# Patient Record
Sex: Female | Born: 1985 | Race: Asian | Hispanic: No | Marital: Single | Smoking: Never smoker
Health system: Southern US, Community
[De-identification: ages and names within clinical notes are randomized; demographics above are authoritative.]

## PROBLEM LIST (undated history)

## (undated) DIAGNOSIS — I10 Essential (primary) hypertension: Secondary | ICD-10-CM

## (undated) HISTORY — PX: APPENDECTOMY: SHX54

---

## 2015-06-20 ENCOUNTER — Ambulatory Visit (INDEPENDENT_AMBULATORY_CARE_PROVIDER_SITE_OTHER): Payer: Managed Care, Other (non HMO) | Admitting: Family Medicine

## 2015-06-20 ENCOUNTER — Ambulatory Visit (INDEPENDENT_AMBULATORY_CARE_PROVIDER_SITE_OTHER): Payer: Managed Care, Other (non HMO)

## 2015-06-20 ENCOUNTER — Encounter (HOSPITAL_COMMUNITY): Payer: Self-pay | Admitting: Emergency Medicine

## 2015-06-20 VITALS — BP 182/116 | HR 70 | Temp 98.2°F | Resp 16 | Ht 66.0 in | Wt 259.0 lb

## 2015-06-20 DIAGNOSIS — Z3202 Encounter for pregnancy test, result negative: Secondary | ICD-10-CM | POA: Insufficient documentation

## 2015-06-20 DIAGNOSIS — I160 Hypertensive urgency: Secondary | ICD-10-CM

## 2015-06-20 DIAGNOSIS — I517 Cardiomegaly: Secondary | ICD-10-CM

## 2015-06-20 DIAGNOSIS — I10 Essential (primary) hypertension: Secondary | ICD-10-CM | POA: Insufficient documentation

## 2015-06-20 DIAGNOSIS — R9431 Abnormal electrocardiogram [ECG] [EKG]: Secondary | ICD-10-CM

## 2015-06-20 NOTE — ED Notes (Signed)
Pt from [facility] for eval of HTN, states hx of same but has not been on meds "in a long time." pt reports starting new job that she has to travel for so has not been working out, eating as well as she normally does, or sleeping as much. Pt with no other complaints, no neuro deficits noted at this time.

## 2015-06-20 NOTE — Progress Notes (Signed)
   06/20/2015 6:37 PM   DOB: 1986-04-17 / MRN: [id number]  SUBJECTIVE:  Taylor Gill is a 30 y.o. female presenting for a BP check.  She is just coming from a physical for work and was advised that her BP was high and told she needed to be seen by a provider for this problem.  She denies HA, chest pain, numbness vision changes, paresthesia, vision changes.  She has had this problem before and had been taking HCTZ 12.5 mg.   She has No Known Allergies.   She  has no past medical history on file.    She  reports that she has never smoked. She does not have any smokeless tobacco history on file. She  has no sexual activity history on file. The patient  has past surgical history that includes Appendectomy.  Her family history is not on file.  Review of Systems  Constitutional: Negative for fever and chills.  Respiratory: Negative for cough and shortness of breath.   Cardiovascular: Negative for chest pain.  Gastrointestinal: Negative for nausea.  Skin: Negative for rash.  Neurological: Negative for dizziness and headaches.    Problem list and medications reviewed and updated by myself where necessary, and exist elsewhere in the encounter.   OBJECTIVE:  BP 182/116 mmHg  Pulse 70  Temp(Src) 98.2 F (36.8 C)  Resp 16  Ht  (1.676 m)  Wt 259 lb (117.482 kg)  BMI 41.82 kg/m2  SpO2 98%  LMP 05/29/2015  Physical Exam  Constitutional: She is oriented to person, place, and time. She appears well-nourished. No distress.  Eyes: EOM are normal. Pupils are equal, round, and reactive to light.  Cardiovascular: Normal rate, regular rhythm, normal heart sounds and intact distal pulses.  Exam reveals no gallop and no friction rub.   No murmur heard. Pulmonary/Chest: Effort normal and breath sounds normal.  Abdominal: She exhibits no distension.  Neurological: She is alert and oriented to person, place, and time. No cranial nerve deficit. Gait normal.  Skin: Skin is dry. She is not  diaphoretic.  Psychiatric: She has a normal mood and affect.  Vitals reviewed.    No results found for this or any previous visit (from the past 72 hour(s)).  No results found.  ASSESSMENT AND PLAN  Taylor Gill was seen today for hypertension.  Diagnoses and all orders for this visit:  Hypertensive urgency: Patient with 225/115 by my exam.  She has mild cardiomegaly on rads and some non specific EKG changes. Case discussed with Dr. Alwyn Ren and he is in agreement. Will send to the ED for further evaluation and treatment.  She may benefit from Cardiologist eval.  She has refused an ambulance tonight.  I advised she call a cab or have a friend drive her.  -     EKG 12-Lead -     POCT CBC -     Creatinine with Est GFR -     DG Chest 2 View; Future -     POCT urine pregnancy  Cardiomegaly: Patient refused ambulance.    Nonspecific abnormal electrocardiogram (ECG) (EKG): Patient refused ambulance.     The patient was advised to call or return to clinic if she does not see an improvement in symptoms or to seek the care of the closest emergency department if she worsens with the above plan.   Deliah Boston, MHS, PA-C [facility] 06/20/2015 6:37 PM

## 2015-06-20 NOTE — Patient Instructions (Addendum)
Because you received an x-ray today, you will receive an invoice from [facility]. Please contact [facility] at [phone] with questions or concerns regarding your invoice. Our billing staff will not be able to assist you with those questions.  [address]  [place], [address]  [phone]

## 2015-06-20 NOTE — Progress Notes (Signed)
Patient seen per the request of Taylor Boston PA-C. She had a job exam today and had a very high blood pressure. She was sent here to have it controlled so she could be approved for her job. The PA was concerned because he got a very high reading of 225/115.   I spoke with the patient. She has not had any chest pain or palpitations or excessive shortness of breath. She has had hypertension in the past, had been on hydrochlorothiazide, stopped taking it a year ago. She checks her blood pressure occasionally and it is up and down. She has never had it is extremely high. No headaches or dizziness.  Chest x-ray does appear to have some cardiomegaly though it may be a little magnified because of her habitus. She has a probably normal EKG with some mild ST elevation in V2 and V3 which is upsloping.  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

## 2015-06-21 ENCOUNTER — Emergency Department (HOSPITAL_COMMUNITY)
Admission: EM | Admit: 2015-06-21 | Discharge: 2015-06-21 | Disposition: A | Discharge status: Home / Self Care | Payer: Managed Care, Other (non HMO) | Attending: Emergency Medicine | Admitting: Emergency Medicine

## 2015-06-21 DIAGNOSIS — I10 Essential (primary) hypertension: Secondary | ICD-10-CM

## 2015-06-21 HISTORY — DX: Essential (primary) hypertension: I10

## 2015-06-21 LAB — URINALYSIS, ROUTINE W REFLEX MICROSCOPIC
Bilirubin Urine: NEGATIVE
GLUCOSE, UA: NEGATIVE mg/dL
HGB URINE DIPSTICK: NEGATIVE
KETONES UR: NEGATIVE mg/dL
LEUKOCYTES UA: NEGATIVE
Nitrite: NEGATIVE
PH: 5.5 (ref 5.0–8.0)
PROTEIN: NEGATIVE mg/dL
Specific Gravity, Urine: 1.026 (ref 1.005–1.030)

## 2015-06-21 LAB — I-STAT CHEM 8, ED
BUN: 12 mg/dL (ref 6–20)
CHLORIDE: 101 mmol/L (ref 101–111)
Calcium, Ion: 1.02 mmol/L — ABNORMAL LOW (ref 1.12–1.23)
Creatinine, Ser: 0.7 mg/dL (ref 0.44–1.00)
Glucose, Bld: 98 mg/dL (ref 65–99)
HEMATOCRIT: 43 % (ref 36.0–46.0)
Hemoglobin: 14.6 g/dL (ref 12.0–15.0)
Potassium: 3.6 mmol/L (ref 3.5–5.1)
SODIUM: 139 mmol/L (ref 135–145)
TCO2: 26 mmol/L (ref 0–100)

## 2015-06-21 LAB — I-STAT BETA HCG BLOOD, ED (MC, WL, AP ONLY): I-stat hCG, quantitative: <5 m[IU]/mL (ref <5)

## 2015-06-21 MED ORDER — HYDROCHLOROTHIAZIDE 12.5 MG PO TABS
12.5000 mg | ORAL_TABLET | Freq: Every day | ORAL | Status: AC
Start: 2015-06-21 — End: ?

## 2015-06-21 NOTE — ED Notes (Signed)
Called for room by two separate staff members - no answer.

## 2015-06-21 NOTE — ED Provider Notes (Signed)
By signing my name below, I, Arlan Organ, attest that this documentation has been prepared under the direction and in the presence of Kristen N Ward, DO.  Electronically Signed: Arlan Organ, ED Scribe. 06/21/2015. 3:40 AM.   TIME SEEN: 3:35 AM   CHIEF COMPLAINT:  Chief Complaint  Patient presents with  . Hypertension     HPI:  HPI Comments: Taylor Gill from [facility] is a 30 y.o. female with a PMHx of HTN who presents to the Emergency Department here for hypertension this evening. Pt recently started a new job and will be traveling often. In the process of her job transition, pt has not been able to work out and hasn't been eating as well. No recent blurred vision, chest pain, shortness of breath, tingling or numbness, focal weakness, headache. Pt was prescribed HCTZ 12.5 mg some time ago but has not been complaint with this medication as her PCP is in a different state. Pt was evaluated at [facility] earlier for same. At that time, EKG and CXR performed without any abnormal findings. However, pt was advised to come to the Emergency Department for further evaluation.  PCP: No primary care provider on file.    ROS: See HPI Constitutional: no fever  Eyes: no drainage  ENT: no runny nose   Cardiovascular:  no chest pain  Resp: no SOB  GI: no vomiting GU: no dysuria Integumentary: no rash  Allergy: no hives  Musculoskeletal: no leg swelling  Neurological: no slurred speech ROS otherwise negative  PAST MEDICAL HISTORY/PAST SURGICAL HISTORY:  Past Medical History  Diagnosis Date  . Hypertension     MEDICATIONS:  Prior to Admission medications   Not on File    ALLERGIES:  No Known Allergies  SOCIAL HISTORY:  Social History  Substance Use Topics  . Smoking status: Never Smoker   . Smokeless tobacco: Not on file  . Alcohol Use: Not on file    FAMILY HISTORY: No family history on file.  EXAM: BP 186/94 mmHg  Pulse 64  Resp 19  Ht  (1.676 m)  Wt 259 lb  (117.482 kg)  BMI 41.82 kg/m2  SpO2 96%  LMP 05/29/2015 CONSTITUTIONAL: Alert and oriented and responds appropriately to questions. Well-appearing; well-nourished HEAD: Normocephalic EYES: Conjunctivae clear, PERRL ENT: normal nose; no rhinorrhea; moist mucous membranes; pharynx without lesions noted NECK: Supple, no meningismus, no LAD  CARD: RRR; S1 and S2 appreciated; no murmurs, no clicks, no rubs, no gallops RESP: Normal chest excursion without splinting or tachypnea; breath sounds clear and equal bilaterally; no wheezes, no rhonchi, no rales, no hypoxia or respiratory distress, speaking full sentences ABD/GI: Normal bowel sounds; non-distended; soft, non-tender, no rebound, no guarding, no peritoneal signs BACK:  The back appears normal and is non-tender to palpation, there is no CVA tenderness EXT: Normal ROM in all joints; non-tender to palpation; no edema; normal capillary refill; no cyanosis, no calf tenderness or swelling    SKIN: Normal color for age and race; warm NEURO: Moves all extremities equally, sensation to light touch intact diffusely, cranial nerves II through XII intact PSYCH: The patient's mood and manner are appropriate. Grooming and personal hygiene are appropriate.  MEDICAL DECISION MAKING: Patient here with asymptomatic hypertension. Patient was previously on hydrochlorothiazide 12.5 mg daily. She agrees to restart this medication. Her blood pressure is 183/92. She has absolutely no symptoms. Labs show no sign of end organ damage. Urine shows no blood or protein. We'll give her primary care physician follow-up in [place]  since she will be here during the week. I feel she is safe to go back to work. Discussed return precautions. Have advised that she check her blood pressure daily and keep a log of this that she can presented to her primary care physician for follow-up.  She verbalizes understanding and is comfortable with this plan.    EKG  Interpretation  Date/Time:  Tuesday June 21 2015 03:29:03 EST Ventricular Rate:  73 PR Interval:  157 QRS Duration: 99 QT Interval:  417 QTC Calculation: 459 R Axis:   -6 Text Interpretation:  Sinus rhythm Low voltage, precordial leads Anteroseptal infarct, old Minimal ST depression, lateral leads Baseline wander in lead(s) I II aVR aVL aVF V1 V2 V3 V4 V5 V6 No old tracing to compare Confirmed by WARD,  DO, KRISTEN ([id number]) on 06/21/2015 3:54:20 AM         I personally performed the services described in this documentation, which was scribed in my presence. The recorded information has been reviewed and is accurate.   Layla Maw Ward, DO 06/21/15 0500

## 2015-06-21 NOTE — Discharge Instructions (Signed)
I recommend that you keep a long of your blood pressure every day when you're feeling well so that she may present this to your primary care physician. We have given you a list of primary care providers in this area. There are also [facility], [facility], and [facility] offices in multiple different areas throughout [place] that are accepting new patients.   DASH Eating Plan DASH stands for "Dietary Approaches to Stop Hypertension." The DASH eating plan is a healthy eating plan that has been shown to reduce high blood pressure (hypertension). Additional health benefits may include reducing the risk of type 2 diabetes mellitus, heart disease, and stroke. The DASH eating plan may also help with weight loss. WHAT DO I NEED TO KNOW ABOUT THE DASH EATING PLAN? For the DASH eating plan, you will follow these general guidelines:  Choose foods with a percent daily value for sodium of less than 5% (as listed on the food label).  Use salt-free seasonings or herbs instead of table salt or sea salt.  Check with your health care provider or pharmacist before using salt substitutes.  Eat lower-sodium products, often labeled as "lower sodium" or "no salt added."  Eat fresh foods.  Eat more vegetables, fruits, and low-fat dairy products.  Choose whole grains. Look for the word "whole" as the first word in the ingredient list.  Choose fish and skinless chicken or [place] more often than red meat. Limit fish, poultry, and meat to 6 oz (170 g) each day.  Limit sweets, desserts, sugars, and sugary drinks.  Choose heart-healthy fats.  Limit cheese to 1 oz (28 g) per day.  Eat more home-cooked food and less restaurant, buffet, and fast food.  Limit fried foods.  Cook foods using methods other than frying.  Limit canned vegetables. If you do use them, rinse them well to decrease the sodium.  When eating at a restaurant, ask that your food be prepared with less salt, or no salt if possible. WHAT FOODS CAN  I EAT? Seek help from a dietitian for individual calorie needs. Grains Whole grain or whole wheat bread. Brown rice. Whole grain or whole wheat pasta. Quinoa, bulgur, and whole grain cereals. Low-sodium cereals. Corn or whole wheat flour tortillas. Whole grain cornbread. Whole grain crackers. Low-sodium crackers. Vegetables Fresh or frozen vegetables (raw, steamed, roasted, or grilled). Low-sodium or reduced-sodium tomato and vegetable juices. Low-sodium or reduced-sodium tomato sauce and paste. Low-sodium or reduced-sodium canned vegetables.  Fruits All fresh, canned (in natural juice), or frozen fruits. Meat and Other Protein Products Ground beef (85% or leaner), grass-fed beef, or beef trimmed of fat. Skinless chicken or [place]. Ground chicken or [place]. Pork trimmed of fat. All fish and seafood. Eggs. Dried beans, peas, or lentils. Unsalted nuts and seeds. Unsalted canned beans. Dairy Low-fat dairy products, such as skim or 1% milk, 2% or reduced-fat cheeses, low-fat ricotta or cottage cheese, or plain low-fat yogurt. Low-sodium or reduced-sodium cheeses. Fats and Oils Tub margarines without trans fats. Light or reduced-fat mayonnaise and salad dressings (reduced sodium). Avocado. Safflower, olive, or canola oils. Natural peanut or almond butter. Other Unsalted popcorn and pretzels. The items listed above may not be a complete list of recommended foods or beverages. Contact your dietitian for more options. WHAT FOODS ARE NOT RECOMMENDED? Grains White bread. White pasta. White rice. Refined cornbread. Bagels and croissants. Crackers that contain trans fat. Vegetables Creamed or fried vegetables. Vegetables in a cheese sauce. Regular canned vegetables. Regular canned tomato sauce and paste. Regular tomato and  vegetable juices. Fruits Dried fruits. Canned fruit in light or heavy syrup. Fruit juice. Meat and Other Protein Products Fatty cuts of meat. Ribs, chicken wings, bacon, sausage,  bologna, salami, chitterlings, fatback, hot dogs, bratwurst, and packaged luncheon meats. Salted nuts and seeds. Canned beans with salt. Dairy Whole or 2% milk, cream, half-and-half, and cream cheese. Whole-fat or sweetened yogurt. Full-fat cheeses or blue cheese. Nondairy creamers and whipped toppings. Processed cheese, cheese spreads, or cheese curds. Condiments Onion and garlic salt, seasoned salt, table salt, and sea salt. Canned and packaged gravies. Worcestershire sauce. Tartar sauce. Barbecue sauce. Teriyaki sauce. Soy sauce, including reduced sodium. Steak sauce. Fish sauce. Oyster sauce. Cocktail sauce. Horseradish. Ketchup and mustard. Meat flavorings and tenderizers. Bouillon cubes. Hot sauce. Tabasco sauce. Marinades. Taco seasonings. Relishes. Fats and Oils Butter, stick margarine, lard, shortening, ghee, and bacon fat. Coconut, palm kernel, or palm oils. Regular salad dressings. Other Pickles and olives. Salted popcorn and pretzels. The items listed above may not be a complete list of foods and beverages to avoid. Contact your dietitian for more information. WHERE CAN I FIND MORE INFORMATION? National Heart, Lung, and Blood Institute: [employer]Prom[employer]it   This information is not intended to replace advice given to you by your health care provider. Make sure you discuss any questions you have with your health care provider.   Document Released: 04/05/2011 Document Revised: 05/07/2014 Document Reviewed: 02/18/2013 Elsevier Interactive Patient Education 2016 [employer].  Hypertension Hypertension, commonly called high blood pressure, is when the force of blood pumping through your arteries is too strong. Your arteries are the blood vessels that carry blood from your heart throughout your body. A blood pressure reading consists of a higher number over a lower number, such as 110/72. The higher number (systolic) is the pressure inside your arteries when  your heart pumps. The lower number (diastolic) is the pressure inside your arteries when your heart relaxes. Ideally you want your blood pressure below 120/80. Hypertension forces your heart to work harder to pump blood. Your arteries may become narrow or stiff. Having untreated or uncontrolled hypertension can cause heart attack, stroke, kidney disease, and other problems. RISK FACTORS Some risk factors for high blood pressure are controllable. Others are not.  Risk factors you cannot control include:   Race. You may be at higher risk if you are African American.  Age. Risk increases with age.  Gender. Men are at higher risk than women before age 32 years. After age 54, women are at higher risk than men. Risk factors you can control include:  Not getting enough exercise or physical activity.  Being overweight.  Getting too much fat, sugar, calories, or salt in your diet.  Drinking too much alcohol. SIGNS AND SYMPTOMS Hypertension does not usually cause signs or symptoms. Extremely high blood pressure (hypertensive crisis) may cause headache, anxiety, shortness of breath, and nosebleed. DIAGNOSIS To check if you have hypertension, your health care provider will measure your blood pressure while you are seated, with your arm held at the level of your heart. It should be measured at least twice using the same arm. Certain conditions can cause a difference in blood pressure between your right and left arms. A blood pressure reading that is higher than normal on one occasion does not mean that you need treatment. If it is not clear whether you have high blood pressure, you may be asked to return on a different day to have your blood pressure checked again. Or, you may be asked  to monitor your blood pressure at home for 1 or more weeks. TREATMENT Treating high blood pressure includes making lifestyle changes and possibly taking medicine. Living a healthy lifestyle can help lower high blood  pressure. You may need to change some of your habits. Lifestyle changes may include:  Following the DASH diet. This diet is high in fruits, vegetables, and whole grains. It is low in salt, red meat, and added sugars.  Keep your sodium intake below 2,300 mg per day.  Getting at least 30-45 minutes of aerobic exercise at least 4 times per week.  Losing weight if necessary.  Not smoking.  Limiting alcoholic beverages.  Learning ways to reduce stress. Your health care provider may prescribe medicine if lifestyle changes are not enough to get your blood pressure under control, and if one of the following is true:  You are 44-77 years of age and your systolic blood pressure is above 140.  You are 12 years of age or older, and your systolic blood pressure is above 150.  Your diastolic blood pressure is above 90.  You have diabetes, and your systolic blood pressure is over 140 or your diastolic blood pressure is over 90.  You have kidney disease and your blood pressure is above 140/90.  You have heart disease and your blood pressure is above 140/90. Your personal target blood pressure may vary depending on your medical conditions, your age, and other factors. HOME CARE INSTRUCTIONS  Have your blood pressure rechecked as directed by your health care provider.   Take medicines only as directed by your health care provider. Follow the directions carefully. Blood pressure medicines must be taken as prescribed. The medicine does not work as well when you skip doses. Skipping doses also puts you at risk for problems.  Do not smoke.   Monitor your blood pressure at home as directed by your health care provider. SEEK MEDICAL CARE IF:   You think you are having a reaction to medicines taken.  You have recurrent headaches or feel dizzy.  You have swelling in your ankles.  You have trouble with your vision. SEEK IMMEDIATE MEDICAL CARE IF:  You develop a severe headache or  confusion.  You have unusual weakness, numbness, or feel faint.  You have severe chest or abdominal pain.  You vomit repeatedly.  You have trouble breathing. MAKE SURE YOU:   Understand these instructions.  Will watch your condition.  Will get help right away if you are not doing well or get worse.   This information is not intended to replace advice given to you by your health care provider. Make sure you discuss any questions you have with your health care provider.   Document Released: 04/16/2005 Document Revised: 08/31/2014 Document Reviewed: 02/06/2013 Elsevier Interactive Patient Education 2016 [employer].  How to Take Your Blood Pressure HOW DO I GET A BLOOD PRESSURE MACHINE?  You can buy an electronic home blood pressure machine at your local pharmacy. Insurance will sometimes cover the cost if you have a prescription.  Ask your doctor what type of machine is best for you. There are different machines for your arm and your wrist.  If you decide to buy a machine to check your blood pressure on your arm, first check the size of your arm so you can buy the right size cuff. To check the size of your arm:   Use a measuring tape that shows both inches and centimeters.   Wrap the measuring tape around the  upper-middle part of your arm. You may need someone to help you measure.   Write down your arm measurement in both inches and centimeters.   To measure your blood pressure correctly, it is important to have the right size cuff.   If your arm is up to 13 inches (up to 34 centimeters), get an adult cuff size.  If your arm is 13 to 17 inches (35 to 44 centimeters), get a large adult cuff size.    If your arm is 17 to 20 inches (45 to 52 centimeters), get an adult thigh cuff.  WHAT DO THE NUMBERS MEAN?   There are two numbers that make up your blood pressure. For example: 120/80.  The first number (120 in our example) is called the "systolic pressure." It is a  measure of the pressure in your blood vessels when your heart is pumping blood.  The second number (80 in our example) is called the "diastolic pressure." It is a measure of the pressure in your blood vessels when your heart is resting between beats.  Your doctor will tell you what your blood pressure should be. WHAT SHOULD I DO BEFORE I CHECK MY BLOOD PRESSURE?   Try to rest or relax for at least 30 minutes before you check your blood pressure.  Do not smoke.  Do not have any drinks with caffeine, such as:  Soda.  Coffee.  Tea.  Check your blood pressure in a quiet room.  Sit down and stretch out your arm on a table. Keep your arm at about the level of your heart. Let your arm relax.  Make sure that your legs are not crossed. HOW DO I CHECK MY BLOOD PRESSURE?  Follow the directions that came with your machine.  Make sure you remove any tight-fitting clothing from your arm or wrist. Wrap the cuff around your upper arm or wrist. You should be able to fit a finger between the cuff and your arm. If you cannot fit a finger between the cuff and your arm, it is too tight and should be removed and rewrapped.  Some units require you to manually pump up the arm cuff.  Automatic units inflate the cuff when you press a button.  Cuff deflation is automatic in both models.  After the cuff is inflated, the unit measures your blood pressure and pulse. The readings are shown on a monitor. Hold still and breathe normally while the cuff is inflated.  Getting a reading takes less than a minute.  Some models store readings in a memory. Some provide a printout of readings. If your machine does not store your readings, keep a written record.  Take readings with you to your next visit with your doctor.   This information is not intended to replace advice given to you by your health care provider. Make sure you discuss any questions you have with your health care provider.   Document Released:  03/29/2008 Document Revised: 05/07/2014 Document Reviewed: 06/11/2013 Elsevier Interactive Patient Education 2016 [employer].      Emergency Department Resource Guide 1) Find a Doctor and Pay Out of Pocket Although you won't have to find out who is covered by your insurance plan, it is a good idea to ask around and get recommendations. You will then need to call the office and see if the doctor you have chosen will accept you as a new patient and what types of options they offer for patients who are self-pay. Some doctors offer  discounts or will set up payment plans for their patients who do not have insurance, but you will need to ask so you aren't surprised when you get to your appointment.  2) Contact Your Local Health Department Not all health departments have doctors that can see patients for sick visits, but many do, so it is worth a call to see if yours does. If you don't know where your local health department is, you can check in your phone book. The CDC also has a tool to help you locate your state's health department, and many state websites also have listings of all of their local health departments.  3) Find a Walk-in Clinic If your illness is not likely to be very severe or complicated, you may want to try a walk in clinic. These are popping up all over the country in pharmacies, drugstores, and shopping centers. They're usually staffed by nurse practitioners or physician assistants that have been trained to treat common illnesses and complaints. They're usually fairly quick and inexpensive. However, if you have serious medical issues or chronic medical problems, these are probably not your best option.  No Primary Care Doctor: - Call Health Connect at  [phone] - they can help you locate a primary care doctor that  accepts your insurance, provides certain services, etc. - Physician Referral Service- [phone]  Chronic Pain Problems: Organization         Address  Phone    Notes  [facility]  [phone] Patients need to be referred by their primary care doctor.   Medication Assistance: Organization         Address  Phone   Notes  [facility] [address] [place], [place] [address] [phone] --Must be a resident of [facility] -- Must have NO insurance coverage whatsoever (no Medicaid/ Medicare, etc.) -- The pt. MUST have a primary care doctor that directs their care regularly and follows them in the community   [facility]  [phone]   [employer]  [phone]    Agencies that provide inexpensive medical care: Organization         Address  Phone   Notes  [facility]  [phone]   [facility]    [phone]   [facility] [address] [place], [place] [address] [phone]   [facility] [address], [place] [phone]   [facility]anned Parenthood    [phone]   [facility]    [phone]   [facility]  [address], [place] Phone:  [phone], Fax:  [phone] Hours of Operation:  9 am - 6 pm, M-F.  Also accepts Medicaid/Medicare and self-pay.  [facility]  [address], [place] Phone: [phone], Fax: [phone]. Hours of Operation:  8:30 am - 5:30 pm, M-F.  Also accepts Medicaid and self-pay.  [facility] [place] [address] [place] Phone: [phone]   [facility] [address] Natasha Bence [place], [place] [phone], Ext. [phone] Mondays & Thursdays: 7-9 AM.  First 15 patients are seen on a first come, first serve basis.    Medicaid-accepting [facility] Providers:  Organization         Address  Phone   Notes  [facility] [address], Ervin Knack, [place] (  [phone]) [phone] Also accepts self-pay patients.  [facility]  [address] Laurell Josephs [place], [place]  [phone]   [facility] [address], [place] [phone]   [facility] [address] [place] [phone]   Renaye Rakers [address], [place]   [phone] Only accepts [place] Access [place] patients after they have their name applied to their card.   Self-Pay (no insurance) in [facility]:  Organization         Address  Phone   Notes  Sickle Cell Patients, [facility] [address] [place], [place] [phone]   [facility] [address] [place], [place] [phone]   [facility] [place]  [address], [place] [phone]   [facility]/Dr. Osei-Bonsu  [address] [place] or [address], [place] [phone] Phone number for both [place] and [place] locations is the same.  [facility]gent [facility] and [facility] [address] [place] [phone]   [facility] [address] [place] or [address] [phone]   [facility] [address], [place] [phone], phone; [phone], fax Sees patients 1st and 3rd Saturday of every month.  Must not qualify for public or private insurance (i.e. Medicaid, Medicare, [place] Health Choice, Veterans' Benefits)  Household income should be no more than 200% of the poverty level The clinic cannot treat you if you are pregnant or think you are pregnant  Sexually transmitted diseases are not treated at the clinic.    Dental Care: Organization         Address  Phone  Notes  [facility] of [facility] [address] [place], [place] [phone] Accepts children up to age 71 who are enrolled in [place] or [place] Health Choice; pregnant women with a Medicaid card; and children who have  applied for Medicaid or [place] Health Choice, but were declined, whose parents can pay a reduced fee at time of service.  [facility] Department of [facility]  [address], [place] [phone] Accepts children up to age 2 who are enrolled in [place] or [place] Health Choice; pregnant women with a Medicaid card; and children who have applied for Medicaid or [place] Health Choice, but were declined, whose parents can pay a reduced fee at time of service.  [facility]  [address] [place], [place] [phone] Patients are seen by appointment only. Walk-ins are not accepted. [facility] will see patients 64 years of age and older. Monday - Tuesday (8am-5pm) Most Wednesdays (8:30-5pm) $30 per visit, cash only  [facility]  [address], [facility] [phone] Patients are seen by appointment only. Walk-ins are not accepted. [facility] will see patients 28 years of age and older. One Wednesday Evening (Monthly: Volunteer Based).  $30 per visit, cash only  [employer]  [phone] for adults; Children under age 45, call Graduate Pediatric Dentistry at [phone]. Children aged 66-14, please call [phone] to request a pediatric application.  Dental services are provided in all areas of dental care including fillings, crowns and bridges, complete and partial dentures, implants, gum treatment, root canals, and extractions. Preventive care is also  provided. Treatment is provided to both adults and children. Patients are selected via a lottery and there is often a waiting list.   [facility] [address], [place]  [phone] [email or web address]   [facility] [address] [place], [place] [phone], Ext. [phone] Second and Fourth Thursday of each month, opens at 6:30 AM; Clinic ends at 9 AM.  Patients are seen on a first-come first-served basis, and a  limited number are seen during each clinic.   [facility]  [address] Ether Griffins [place], [place] [phone]   Eligibility Requirements You must have lived in [place], [place], or [place] counties for at least the last three months.   You cannot be eligible for state or federal sponsored [place], including [employer], [place], or [employer].   You generally cannot be eligible for healthcare insurance through your employer.    How to apply: Eligibility screenings are held every Tuesday and Wednesday afternoon from 1:00 pm until 4:00 pm. You do not need an appointment for the interview!  [facility] [address], [place], [place] [phone]   [facility] Health Department  [phone]   [facility] Health Department  [phone]   [facility] Health Department  [phone]    Behavioral Health Resources in the Community: Intensive Outpatient Programs Organization         Address  Phone  Notes  [facility] [address] [place], [place] [phone]   [facility] [address] [place], [place] [phone]   [facility] [address], [place], [place]  [phone]   [facility] [address],  [place], [place] [phone] or [phone]   Substance Abuse Resources Organization         Address  Phone  Notes  [facility]  [phone]   [facility]  [phone]   [facility] [place]  [phone]   Floydene Flock  [phone]   Residential & Outpatient Substance Abuse Program  [phone]   Psychological Services Organization         Address  Phone  Notes  [facility] Behavioral Health  [phone]   [facility] Services  [phone]   [facility] Mental Health [address] [place] [phone] or [phone]    Mobile Crisis Teams Organization          Address  Phone  Notes  [facility], Mobile Crisis Care Unit  [phone]   [facility]  [address] [place], [place] [phone]   Doristine Locks [address] [place] [phone]    Self-Help/Support Groups Organization         Address  Phone             Notes  [facility] of  - variety of support groups  [phone]- [phone] Call for more information  Narcotics Anonymous (NA), [facility] [address], [employer] [place]  2 meetings at this location   Statistician         Address  Phone  Notes  [facility] [address],    [place]  [phone]   [facility]  [address] [place] [address], [place], [place] [phone]   [facility] [address] [place] [employer], [place] [phone] Admissions: 8am-3pm M-F  [facility] [address]  [place], [place] [phone]   [facility] [address] Starling Manns [place], [place] [phone]   [facility] [address],  [place], [place] [phone]   [facility] Intensive Outpatient [address], [place], [place] [phone]   [facility] [address]  [place], [place] [phone] or [phone]   [facility] [address] [place], [place] [phone] Accepts Medicaid  [facility] [place] [address],  [place] [phone] Substance Abuse/Addiction Treatment   [facility]         Address  Phone  Notes  [facility]  [phone]   Angie Fava, PhD [address] Ervin Knack [place], [place]   [phone] or [phone]   [facility]   [address] [place], [place] [phone]   [facility] [address] [place], [place] [phone] Insurance/Medicaid/sponsorship  through [facility] [address]                                    [place], [place] [phone] Therapy/tele-psych/case  [facility] [address] [place], [place] [phone]    Dr. Lolly Mustache  [phone]   [facility]  [employer] [facility] Dept. 1) [address] [place] 2) [address], [place] 3)  [address], [place] [phone]  [phone]   [facility] Child Abuse Hotline [phone] or [phone] (After Hours)

## 2015-06-21 NOTE — ED Notes (Signed)
Pts name called for room x 2, this time pt answered. Pt to next available room.

## 2017-02-20 IMAGING — CR DG CHEST 2V
2 series · 2 of 2 positions shown · non-contrast
Comparison: None.

CLINICAL DATA: Hypertension.

EXAM:
CHEST  2 VIEW

[PA]
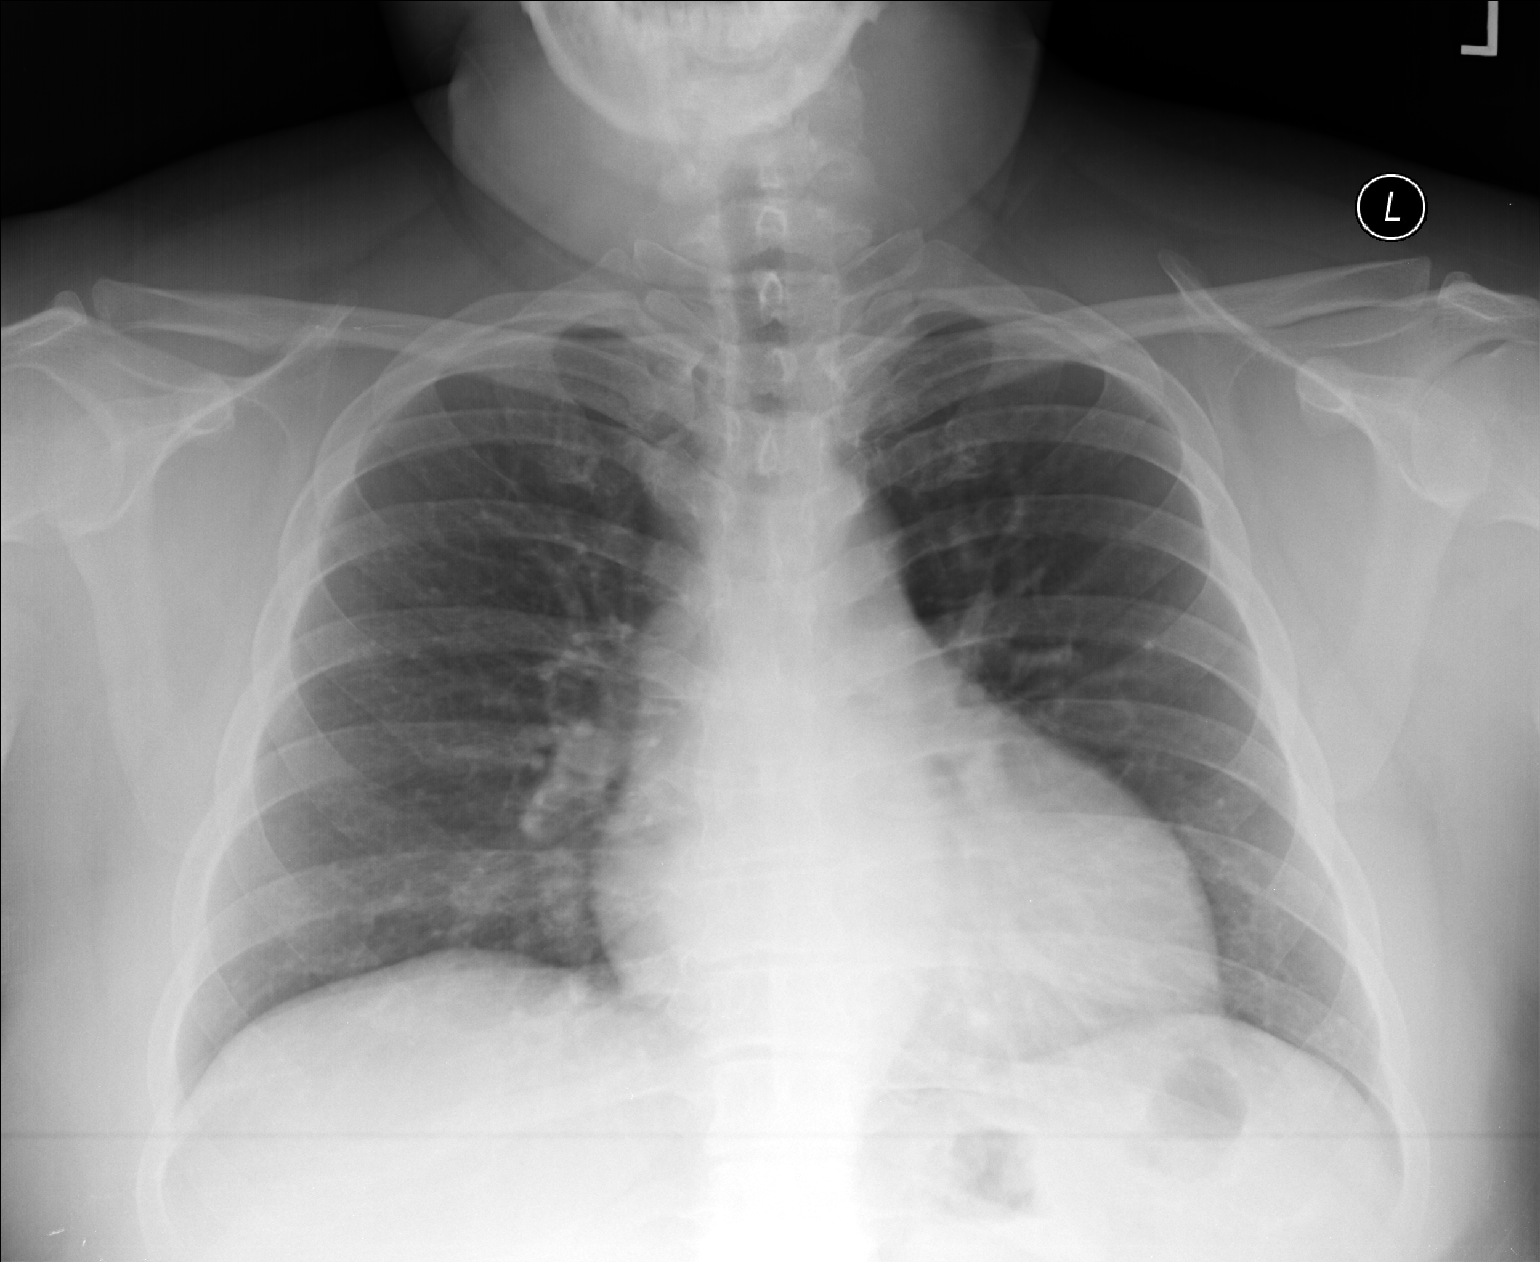

[lateral]
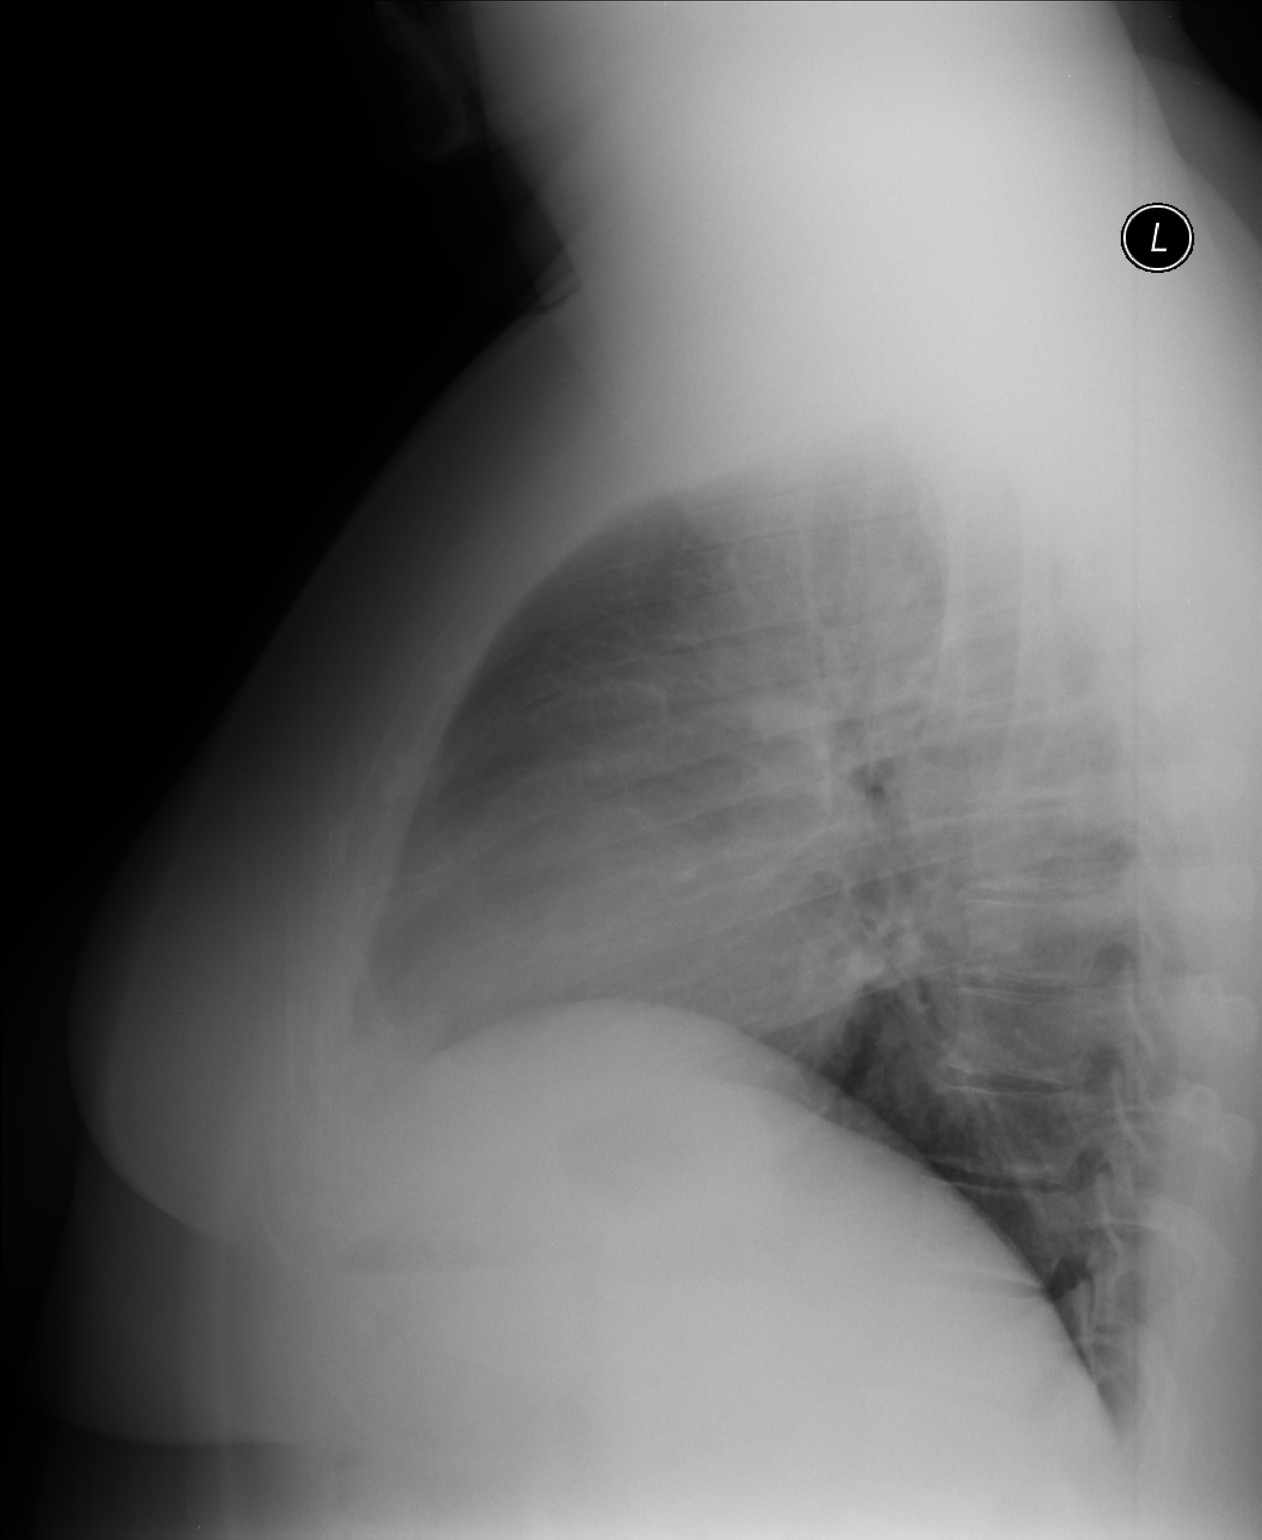

[2 of 2 positions shown; findings below may reference images not displayed]

FINDINGS: The lungs are clear wiithout focal pneumonia, edema, pneumothorax or
pleural effusion. Cardiopericardial silhouette is at upper limits of
normal for size. The visualized bony structures of the thorax are
intact.
IMPRESSION: No active cardiopulmonary disease.
